# Patient Record
Sex: Female | Born: 1955 | Hispanic: Yes | Smoking: Former smoker
Health system: Southern US, Community
[De-identification: ages and names within clinical notes are randomized; demographics above are authoritative.]

## PROBLEM LIST (undated history)

## (undated) DIAGNOSIS — E079 Disorder of thyroid, unspecified: Secondary | ICD-10-CM

## (undated) DIAGNOSIS — E785 Hyperlipidemia, unspecified: Secondary | ICD-10-CM

## (undated) DIAGNOSIS — I10 Essential (primary) hypertension: Secondary | ICD-10-CM

## (undated) HISTORY — PX: ABDOMINAL HYSTERECTOMY: SHX81

## (undated) HISTORY — PX: TONSILLECTOMY: SUR1361

## (undated) HISTORY — PX: NOSE SURGERY: SHX723

---

## 2006-08-03 ENCOUNTER — Ambulatory Visit: Payer: Self-pay | Admitting: Family Medicine

## 2006-12-22 ENCOUNTER — Ambulatory Visit: Payer: Self-pay | Admitting: Obstetrics and Gynecology

## 2007-01-01 ENCOUNTER — Inpatient Hospital Stay: Payer: Self-pay | Admitting: Obstetrics and Gynecology

## 2007-08-02 ENCOUNTER — Ambulatory Visit: Payer: Self-pay | Admitting: Obstetrics and Gynecology

## 2007-10-19 ENCOUNTER — Ambulatory Visit: Payer: Self-pay | Admitting: Gastroenterology

## 2007-10-23 ENCOUNTER — Ambulatory Visit: Payer: Self-pay | Admitting: Gastroenterology

## 2007-12-03 ENCOUNTER — Ambulatory Visit: Payer: Self-pay | Admitting: Gastroenterology

## 2008-11-06 ENCOUNTER — Ambulatory Visit: Payer: Self-pay | Admitting: Family Medicine

## 2009-01-01 ENCOUNTER — Ambulatory Visit: Payer: Self-pay | Admitting: Otolaryngology

## 2009-11-12 ENCOUNTER — Ambulatory Visit: Payer: Self-pay | Admitting: Family Medicine

## 2010-10-23 DIAGNOSIS — I200 Unstable angina: Secondary | ICD-10-CM

## 2010-10-24 ENCOUNTER — Observation Stay: Payer: Self-pay | Admitting: Internal Medicine

## 2010-10-24 DIAGNOSIS — R079 Chest pain, unspecified: Secondary | ICD-10-CM

## 2010-12-09 ENCOUNTER — Ambulatory Visit: Payer: Self-pay | Admitting: Family Medicine

## 2012-01-06 ENCOUNTER — Ambulatory Visit: Payer: Self-pay | Admitting: Family Medicine

## 2013-01-08 ENCOUNTER — Ambulatory Visit: Payer: Self-pay | Admitting: Family Medicine

## 2013-04-15 ENCOUNTER — Ambulatory Visit: Payer: Self-pay | Admitting: Gastroenterology

## 2013-05-11 ENCOUNTER — Emergency Department: Payer: Self-pay | Admitting: Emergency Medicine

## 2014-08-20 ENCOUNTER — Other Ambulatory Visit: Payer: Self-pay | Admitting: Family Medicine

## 2014-08-20 DIAGNOSIS — Z1231 Encounter for screening mammogram for malignant neoplasm of breast: Secondary | ICD-10-CM

## 2014-09-16 ENCOUNTER — Ambulatory Visit
Admission: RE | Admit: 2014-09-16 | Discharge: 2014-09-16 | Disposition: A | Discharge status: Home / Self Care | Payer: BC Managed Care – PPO | Source: Ambulatory Visit | Attending: Family Medicine | Admitting: Family Medicine

## 2014-09-16 DIAGNOSIS — Z1231 Encounter for screening mammogram for malignant neoplasm of breast: Secondary | ICD-10-CM | POA: Insufficient documentation

## 2015-09-22 ENCOUNTER — Other Ambulatory Visit: Payer: Self-pay | Admitting: Family Medicine

## 2015-09-22 DIAGNOSIS — Z1231 Encounter for screening mammogram for malignant neoplasm of breast: Secondary | ICD-10-CM

## 2017-07-07 ENCOUNTER — Other Ambulatory Visit: Payer: Self-pay | Admitting: Nurse Practitioner

## 2017-07-07 DIAGNOSIS — Z1231 Encounter for screening mammogram for malignant neoplasm of breast: Secondary | ICD-10-CM

## 2018-05-10 ENCOUNTER — Encounter: Payer: Self-pay | Admitting: Emergency Medicine

## 2018-05-10 ENCOUNTER — Other Ambulatory Visit: Payer: Self-pay

## 2018-05-10 ENCOUNTER — Ambulatory Visit
Admission: EM | Admit: 2018-05-10 | Discharge: 2018-05-10 | Disposition: A | Discharge status: Home / Self Care | Payer: BC Managed Care – PPO | Attending: Emergency Medicine | Admitting: Emergency Medicine

## 2018-05-10 DIAGNOSIS — J101 Influenza due to other identified influenza virus with other respiratory manifestations: Secondary | ICD-10-CM | POA: Diagnosis not present

## 2018-05-10 HISTORY — DX: Hyperlipidemia, unspecified: E78.5

## 2018-05-10 HISTORY — DX: Disorder of thyroid, unspecified: E07.9

## 2018-05-10 HISTORY — DX: Essential (primary) hypertension: I10

## 2018-05-10 LAB — RAPID INFLUENZA A&B ANTIGENS
Influenza A (ARMC): POSITIVE — AB
Influenza B (ARMC): NEGATIVE

## 2018-05-10 MED ORDER — BALOXAVIR MARBOXIL(40 MG DOSE) 2 X 20 MG PO TBPK: 2.0000 | ORAL_TABLET | Freq: Once | ORAL | 0 refills | Status: DC

## 2018-05-10 MED ORDER — FLUTICASONE PROPIONATE 50 MCG/ACT NA SUSP: 2.0000 | Freq: Every day | NASAL | 0 refills | Status: DC

## 2018-05-10 MED ORDER — BALOXAVIR MARBOXIL(40 MG DOSE) 2 X 20 MG PO TBPK: 2.0000 | ORAL_TABLET | Freq: Once | ORAL | 0 refills | Status: AC

## 2018-05-10 MED ORDER — HYDROCOD POLST-CPM POLST ER 10-8 MG/5ML PO SUER: 5.0000 mL | Freq: Two times a day (BID) | ORAL | 0 refills | Status: AC | PRN

## 2018-05-10 MED ORDER — FLUTICASONE PROPIONATE 50 MCG/ACT NA SUSP: 2.0000 | Freq: Every day | NASAL | 0 refills | Status: AC

## 2018-05-10 MED ORDER — HYDROCOD POLST-CPM POLST ER 10-8 MG/5ML PO SUER: 5.0000 mL | Freq: Two times a day (BID) | ORAL | 0 refills | Status: DC | PRN

## 2018-05-10 NOTE — Discharge Instructions (Addendum)
Xofluza for the flu.  Tussionex for the cough, Flonase, 1 g of Tylenol combined with 400 mg of ibuprofen together 3 or 4 times a day as needed.  Push Fluids.

## 2018-05-10 NOTE — ED Triage Notes (Addendum)
Patient in today c/o cough and fever (102) x 1 day. Patient also c/o headache and body aches. Patient's last dose of Tylenol was at ~5:30am today. Patient has tried OTC Dayquil/Nyquil.

## 2018-05-10 NOTE — ED Provider Notes (Signed)
HPI  SUBJECTIVE:  Jill Fritz is a 63 y.o. female who presents with the acute onset of body aches, headaches, cough, fevers T-max 102 starting yesterday.  She reports postnasal drip, sore throat, mild photophobia.  She is taking Tylenol, DayQuil, NyQuil.  The Tylenol helps.  Last dose of Tylenol was within 4 to 6 hours of evaluation.  No aggravating factors.  States that she is unable to sleep at night secondary to the cough.  No nasal congestion, rhinorrhea, neck stiffness, wheezing, chest pain, shortness of breath, vomiting, diarrhea, rash.  She did not get a flu shot this year.  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  Past medical history negative for asthma, emphysema, COPD, smoking, diabetes, hypertension.  PMD: [facility].    Past Medical History:  Diagnosis Date  . Hyperlipidemia   . Hypertension   . Thyroid disease     Past Surgical History:  Procedure Laterality Date  . ABDOMINAL HYSTERECTOMY    . NOSE SURGERY     nasal perforation  . TONSILLECTOMY      Family History  Problem Relation Age of Onset  . Cushing syndrome Mother   . Diabetes Mother   . Heart attack Father 38  . Hypertension Father     Social History   Tobacco Use  . Smoking status: Former Smoker    Last attempt to quit: 05/10/1998    Years since quitting: 20.0  . Smokeless tobacco: Never Used  Substance Use Topics  . Alcohol use: Yes    Comment: occassional  . Drug use: Never    No current facility-administered medications for this encounter.   Current Outpatient Medications:  .  atorvastatin (LIPITOR) 10 MG tablet, TAKE 1 TABLET BY MOUTH EVERY DAY, Disp: , Rfl:  .  levothyroxine (SYNTHROID, LEVOTHROID) 50 MCG tablet, Take by mouth., Disp: , Rfl:  .  lisinopril-hydrochlorothiazide (PRINZIDE,ZESTORETIC) 10-12.5 MG tablet, TAKE 1 TABLET BY MOUTH EVERY DAY, Disp: , Rfl:  .  Baloxavir Marboxil,40 MG Dose, (XOFLUZA) 2 x 20 MG TBPK, Take 2 tablets by mouth  once for 1 dose., Disp: 2 each, Rfl: 0 .  chlorpheniramine-HYDROcodone (TUSSIONEX PENNKINETIC ER) 10-8 MG/5ML SUER, Take 5 mLs by mouth every 12 (twelve) hours as needed for cough., Disp: 60 mL, Rfl: 0 .  fluticasone (FLONASE) 50 MCG/ACT nasal spray, Place 2 sprays into both nostrils daily., Disp: 16 g, Rfl: 0  Allergies  Allergen Reactions  . Aspirin Nausea Only     ROS  As noted in HPI.   Physical Exam  BP 117/81 (BP Location: Left Arm)   Pulse 95   Temp 98.2 F (36.8 C) (Oral)   Resp 16   Ht 5' (1.524 m)   Wt 62.1 kg   SpO2 97%   BMI 26.76 kg/m   Constitutional: Well developed, well nourished, no acute distress Eyes: PERRL, EOMI, conjunctiva normal bilaterally HENT: Normocephalic, atraumatic,mucus membranes moist.  Clear mild nasal congestion.  Normal turbinates.  No sinus tenderness.  Normal oropharynx. tonsils surgically absent.  Positive cobblestoning.  No postnasal drip. Neck: No cervical lymphadenopathy, meningismus. Respiratory: Clear to auscultation bilaterally, no rales, no wheezing, no rhonchi Cardiovascular: Normal rate and rhythm, no murmurs, no gallops, no rubs GI: Soft, nondistended, normal bowel sounds, mild diffuse tenderness patient thinks that this is from coughing, no rebound, no guarding Back: no CVAT skin: No rash, skin intact Musculoskeletal: No edema, no tenderness, no deformities Neurologic: Alert & oriented x 3, CN  II-XII grossly intact, no motor deficits, sensation grossly intact Psychiatric: Speech and behavior appropriate   ED Course   Medications - No data to display  Orders Placed This Encounter  Procedures  . Rapid Influenza A&B Antigens ([facility] only)    Standing Status:   Standing    Number of Occurrences:   1  . Droplet precaution    Standing Status:   Standing    Number of Occurrences:   1   Results for orders placed or performed during the hospital encounter of 05/10/18 (from the past 24 hour(s))  Rapid Influenza A&B Antigens  ([facility] only)     Status: Abnormal   Collection Time: 05/10/18  9:17 AM  Result Value Ref Range   Influenza A ([facility]) POSITIVE (A) NEGATIVE   Influenza B ([facility]) NEGATIVE NEGATIVE   No results found.  ED Clinical Impression  Influenza A   ED Assessment/Plan   [place] Narcotic database reviewed for this patient, and feel that the risk/benefit ratio today is favorable for proceeding with a prescription for controlled substance.  No opiate prescriptions in 2 years.  Flu a positive.  Sending home with Lasandra Beech per patient request.  Giving her a list of pharmacies that carry it. Also Tussionex for the cough, Flonase, Tylenol/ibuprofen together 3 or 4 times a day as needed.  Push Fluids.  Printed copies of prescriptions so that she can take them anywhere.  Work note for Friday and Monday as she is a Runner, broadcasting/film/video.  Discussed labs,  MDM, treatment plan, and plan for follow-up with patient Discussed sn/sx that should prompt return to the ED. patient agrees with plan.   Meds ordered this encounter  Medications  . DISCONTD: chlorpheniramine-HYDROcodone (TUSSIONEX PENNKINETIC ER) 10-8 MG/5ML SUER    Sig: Take 5 mLs by mouth every 12 (twelve) hours as needed for cough.    Dispense:  60 mL    Refill:  0  . DISCONTD: fluticasone (FLONASE) 50 MCG/ACT nasal spray    Sig: Place 2 sprays into both nostrils daily.    Dispense:  16 g    Refill:  0  . DISCONTD: Baloxavir Marboxil,40 MG Dose, (XOFLUZA) 2 x 20 MG TBPK    Sig: Take 2 tablets by mouth once for 1 dose.    Dispense:  2 each    Refill:  0  . Baloxavir Marboxil,40 MG Dose, (XOFLUZA) 2 x 20 MG TBPK    Sig: Take 2 tablets by mouth once for 1 dose.    Dispense:  2 each    Refill:  0  . chlorpheniramine-HYDROcodone (TUSSIONEX PENNKINETIC ER) 10-8 MG/5ML SUER    Sig: Take 5 mLs by mouth every 12 (twelve) hours as needed for cough.    Dispense:  60 mL    Refill:  0  . fluticasone (FLONASE) 50 MCG/ACT nasal spray    Sig: Place 2 sprays into both nostrils  daily.    Dispense:  16 g    Refill:  0    *This clinic note was created using Scientist, clinical (histocompatibility and immunogenetics). Therefore, there may be occasional mistakes despite careful proofreading.  ?    Domenick Gong, MD 05/11/18 [phone]

## 2018-09-12 ENCOUNTER — Other Ambulatory Visit: Payer: Self-pay | Admitting: Nurse Practitioner

## 2018-09-12 DIAGNOSIS — Z1231 Encounter for screening mammogram for malignant neoplasm of breast: Secondary | ICD-10-CM

## 2018-10-02 ENCOUNTER — Other Ambulatory Visit: Payer: Self-pay

## 2018-10-02 ENCOUNTER — Ambulatory Visit
Admission: RE | Admit: 2018-10-02 | Discharge: 2018-10-02 | Disposition: A | Discharge status: Home / Self Care | Payer: BC Managed Care – PPO | Source: Ambulatory Visit | Attending: Nurse Practitioner | Admitting: Nurse Practitioner

## 2018-10-02 ENCOUNTER — Encounter (INDEPENDENT_AMBULATORY_CARE_PROVIDER_SITE_OTHER): Payer: Self-pay

## 2018-10-02 DIAGNOSIS — Z1231 Encounter for screening mammogram for malignant neoplasm of breast: Secondary | ICD-10-CM | POA: Diagnosis present

## 2018-12-05 ENCOUNTER — Other Ambulatory Visit: Payer: Self-pay

## 2018-12-05 DIAGNOSIS — Z20822 Contact with and (suspected) exposure to covid-19: Secondary | ICD-10-CM

## 2018-12-06 LAB — NOVEL CORONAVIRUS, NAA: SARS-CoV-2, NAA: NOT DETECTED

## 2018-12-06 LAB — SPECIMEN STATUS REPORT

## 2019-09-24 ENCOUNTER — Other Ambulatory Visit: Payer: Self-pay | Admitting: Nurse Practitioner

## 2019-09-24 DIAGNOSIS — Z1231 Encounter for screening mammogram for malignant neoplasm of breast: Secondary | ICD-10-CM

## 2019-10-03 ENCOUNTER — Emergency Department
Admission: EM | Admit: 2019-10-03 | Discharge: 2019-10-03 | Disposition: A | Discharge status: Home / Self Care | Payer: BC Managed Care – PPO | Attending: Student in an Organized Health Care Education/Training Program | Admitting: Student in an Organized Health Care Education/Training Program

## 2019-10-03 ENCOUNTER — Emergency Department: Payer: BC Managed Care – PPO

## 2019-10-03 ENCOUNTER — Other Ambulatory Visit: Payer: Self-pay

## 2019-10-03 ENCOUNTER — Encounter: Payer: Self-pay | Admitting: Emergency Medicine

## 2019-10-03 DIAGNOSIS — Z87891 Personal history of nicotine dependence: Secondary | ICD-10-CM | POA: Diagnosis not present

## 2019-10-03 DIAGNOSIS — I10 Essential (primary) hypertension: Secondary | ICD-10-CM | POA: Diagnosis not present

## 2019-10-03 DIAGNOSIS — R42 Dizziness and giddiness: Secondary | ICD-10-CM | POA: Diagnosis present

## 2019-10-03 LAB — BASIC METABOLIC PANEL
Anion gap: 13 (ref 5–15)
BUN: 16 mg/dL (ref 8–23)
CO2: 23 mmol/L (ref 22–32)
Calcium: 9.7 mg/dL (ref 8.9–10.3)
Chloride: 103 mmol/L (ref 98–111)
Creatinine, Ser: 0.72 mg/dL (ref 0.44–1.00)
GFR calc Af Amer: >60 mL/min (ref >60)
GFR calc non Af Amer: >60 mL/min (ref >60)
Glucose, Bld: 105 mg/dL — ABNORMAL HIGH (ref 70–99)
Potassium: 3.9 mmol/L (ref 3.5–5.1)
Sodium: 139 mmol/L (ref 135–145)

## 2019-10-03 LAB — CBC
HCT: 41.8 % (ref 36.0–46.0)
Hemoglobin: 14.6 g/dL (ref 12.0–15.0)
MCH: 33.3 pg (ref 26.0–34.0)
MCHC: 34.9 g/dL (ref 30.0–36.0)
MCV: 95.2 fL (ref 80.0–100.0)
Platelets: 256 10*3/uL (ref 150–400)
RBC: 4.39 MIL/uL (ref 3.87–5.11)
RDW: 12.6 % (ref 11.5–15.5)
WBC: 7 10*3/uL (ref 4.0–10.5)
nRBC: 0 % (ref 0.0–0.2)

## 2019-10-03 LAB — GLUCOSE, CAPILLARY: Glucose-Capillary: 89 mg/dL (ref 70–99)

## 2019-10-03 LAB — TROPONIN I (HIGH SENSITIVITY): Troponin I (High Sensitivity): 3 ng/L (ref <18)

## 2019-10-03 MED ORDER — AMLODIPINE BESYLATE 5 MG PO TABS: 5.0000 mg | ORAL_TABLET | Freq: Every day | ORAL | 0 refills | Status: AC

## 2019-10-03 NOTE — ED Provider Notes (Signed)
[facility] Emergency Department Provider Note    First MD Initiated Contact with Patient 10/03/19 1834     (approximate)  I have reviewed the triage vital signs and the nursing notes.   HISTORY  Chief Complaint Dizziness    HPI Jill Fritz is a 64 y.o. female below listed past medical history presents to the ER for evaluation of high blood pressure as well as some blurry vision.  Feels like that has subsided.  States her blood pressure were in the 180s and 190s when checking at home.  Was feeling very stressed at that time.  Felt like her legs were becoming weak bilaterally.  Denies any weakness on one side of her body.  Denies any headache.  Did have some brief left shoulder pain.  No neck pain.  Denies any pain or shortness of breath at this time.  States has been a very stressful year.  She did take her blood pressure medication this morning and then when noting her blood pressure was high again she took another.    Past Medical History:  Diagnosis Date  . Hyperlipidemia   . Hypertension   . Thyroid disease    Family History  Problem Relation Age of Onset  . Cushing syndrome Mother   . Diabetes Mother   . Heart attack Father 77  . Hypertension Father   . Breast cancer Sister    Past Surgical History:  Procedure Laterality Date  . ABDOMINAL HYSTERECTOMY    . NOSE SURGERY     nasal perforation  . TONSILLECTOMY     There are no problems to display for this patient.     Prior to Admission medications   Medication Sig Start Date End Date Taking? Authorizing Provider  amLODipine (NORVASC) 5 MG tablet Take 1 tablet (5 mg total) by mouth daily. 10/03/19 10/02/20  Merlyn Lot, MD  atorvastatin (LIPITOR) 10 MG tablet TAKE 1 TABLET BY MOUTH EVERY DAY 01/02/17   [provider]  chlorpheniramine-HYDROcodone (TUSSIONEX PENNKINETIC ER) 10-8 MG/5ML SUER Take 5 mLs by mouth every 12 (twelve) hours as needed for cough. 05/10/18    Melynda Ripple, MD  fluticasone (FLONASE) 50 MCG/ACT nasal spray Place 2 sprays into both nostrils daily. 05/10/18   Melynda Ripple, MD  levothyroxine (SYNTHROID, LEVOTHROID) 50 MCG tablet Take by mouth. 02/13/18   [provider]  lisinopril-hydrochlorothiazide (PRINZIDE,ZESTORETIC) 10-12.5 MG tablet TAKE 1 TABLET BY MOUTH EVERY DAY 04/18/18   [provider]    Allergies Aspirin    Social History Social History   Tobacco Use  . Smoking status: Former Smoker    Quit date: 05/10/1998    Years since quitting: 21.4  . Smokeless tobacco: Never Used  Vaping Use  . Vaping Use: Never used  Substance Use Topics  . Alcohol use: Yes    Comment: occassional  . Drug use: Never    Review of Systems Patient denies headaches, rhinorrhea, blurry vision, numbness, shortness of breath, chest pain, edema, cough, abdominal pain, nausea, vomiting, diarrhea, dysuria, fevers, rashes or hallucinations unless otherwise stated above in HPI. ____________________________________________   PHYSICAL EXAM:  VITAL SIGNS: Vitals:   10/03/19 2010 10/03/19 2011  BP:  (!) 150/89  Pulse: 77 76  Resp: 17 19  Temp:    SpO2: 96% 97%    Constitutional: Alert and oriented.  Eyes: Conjunctivae are normal.  Head: Atraumatic. Nose: No congestion/rhinnorhea. Mouth/Throat: Mucous membranes are moist.   Neck: No stridor. Painless ROM.  Cardiovascular: Normal rate,  regular rhythm. Grossly normal heart sounds.  Good peripheral circulation. Respiratory: Normal respiratory effort.  No retractions. Lungs CTAB. Gastrointestinal: Soft and nontender. No distention. No abdominal bruits. No CVA tenderness. Genitourinary:  Musculoskeletal: No lower extremity tenderness nor edema.  No joint effusions. Neurologic:  Normal speech and language. No gross focal neurologic deficits are appreciated. No facial droop Skin:  Skin is warm, dry and intact. No rash noted. Psychiatric: Mood and affect are normal.  Speech and behavior are normal.  ____________________________________________   LABS (all labs ordered are listed, but only abnormal results are displayed)  Results for orders placed or performed during the hospital encounter of 10/03/19 (from the past 24 hour(s))  Basic metabolic panel     Status: Abnormal   Collection Time: 10/03/19  2:27 PM  Result Value Ref Range   Sodium 139 135 - 145 mmol/L   Potassium 3.9 3.5 - 5.1 mmol/L   Chloride 103 98 - 111 mmol/L   CO2 23 22 - 32 mmol/L   Glucose, Bld 105 (H) 70 - 99 mg/dL   BUN 16 8 - 23 mg/dL   Creatinine, Ser 5.78 0.44 - 1.00 mg/dL   Calcium 9.7 8.9 - 46.9 mg/dL   GFR calc non Af Amer >60 >60 mL/min   GFR calc Af Amer >60 >60 mL/min   Anion gap 13 5 - 15  CBC     Status: None   Collection Time: 10/03/19  2:27 PM  Result Value Ref Range   WBC 7.0 4.0 - 10.5 K/uL   RBC 4.39 3.87 - 5.11 MIL/uL   Hemoglobin 14.6 12.0 - 15.0 g/dL   HCT 62.9 36 - 46 %   MCV 95.2 80.0 - 100.0 fL   MCH 33.3 26.0 - 34.0 pg   MCHC 34.9 30.0 - 36.0 g/dL   RDW 52.8 41.3 - 24.4 %   Platelets 256 150 - 400 K/uL   nRBC 0.0 0.0 - 0.2 %  Troponin I (High Sensitivity)     Status: None   Collection Time: 10/03/19  2:27 PM  Result Value Ref Range   Troponin I (High Sensitivity) 3 <18 ng/L  Glucose, capillary     Status: None   Collection Time: 10/03/19  2:31 PM  Result Value Ref Range   Glucose-Capillary 89 70 - 99 mg/dL   ____________________________________________  EKG My review and personal interpretation at Time: 14:14   Indication: htn  Rate: 80  Rhythm: sinus Axis: normal Other: noral intervals, nonspecific t wave abn, no stemi or depression ____________________________________________  RADIOLOGY I personally reviewed all radiographic images ordered to evaluate for the above acute complaints and reviewed radiology reports and findings.  These findings were personally discussed with the patient.  Please see medical record for radiology  report.  ____________________________________________   PROCEDURES  Procedure(s) performed:  Procedures    Critical Care performed: no ____________________________________________   INITIAL IMPRESSION / ASSESSMENT AND PLAN / ED COURSE  Pertinent labs & imaging results that were available during my care of the patient were reviewed by me and considered in my medical decision making (see chart for details).   DDX: htn, acs, cva, med noncomlpiance, aki, stress  Jill Fritz is a 64 y.o. who presents to the ED with Non-distressed patient presenting with concern for elevated BP and blurry visiton. Patient is AF,VSS with HTN in ED. Exam as above. Given current presentation have considered the above differential. No report of missed antihypertensive doses or medical non-compliance. Extensive evaluation of possible end  organ damage pursued in ED. No evidence of acute renal dysfunction. Neuro exam without focal deficits. EKG without evidence of ischemia. Trop negative. Renal function nml. Not consistent with CHF, malignant htn, adrenergic crisis or hypertensive emergency. CT head orderd out of triage reassuring.  Will have patient add norvasc to current regimen given Bps and follow up with PCP for BP recheck. Discussed strict return parameters.      The patient was evaluated in Emergency Department today for the symptoms described in the history of present illness. He/she was evaluated in the context of the global COVID-19 pandemic, which necessitated consideration that the patient might be at risk for infection with the SARS-CoV-2 virus that causes COVID-19. Institutional protocols and algorithms that pertain to the evaluation of patients at risk for COVID-19 are in a state of rapid change based on information released by regulatory bodies including the CDC and federal and state organizations. These policies and algorithms were followed during the patient's care in the ED.  As part of  my medical decision making, I reviewed the following data within the electronic MEDICAL RECORD NUMBER Nursing notes reviewed and incorporated, Labs reviewed, notes from prior ED visits and [place] Controlled Substance Database   ____________________________________________   FINAL CLINICAL IMPRESSION(S) / ED DIAGNOSES  Final diagnoses:  Hypertension, unspecified type      NEW MEDICATIONS STARTED DURING THIS VISIT:  New Prescriptions   AMLODIPINE (NORVASC) 5 MG TABLET    Take 1 tablet (5 mg total) by mouth daily.     Note:  This document was prepared using Dragon voice recognition software and may include unintentional dictation errors.    Willy Eddy, MD 10/03/19 2100

## 2019-10-03 NOTE — ED Triage Notes (Signed)
Patient presents to the ED with hypertension, dizziness and blurry vision.  Patient states she noticed her blood pressure has been slightly elevated over the past 2 days.  Patient reports feeling dizzy approx. 1 hour prior to arrival and began to have blurry vision on the way to the hospital.  Patient's skin is slightly clammy and patient reports feeling anxious.  Patient denies chest pain.

## 2019-10-08 ENCOUNTER — Ambulatory Visit
Admission: RE | Admit: 2019-10-08 | Discharge: 2019-10-08 | Disposition: A | Discharge status: Home / Self Care | Payer: BC Managed Care – PPO | Source: Ambulatory Visit | Attending: Nurse Practitioner | Admitting: Nurse Practitioner

## 2019-10-08 ENCOUNTER — Other Ambulatory Visit: Payer: Self-pay

## 2019-10-08 DIAGNOSIS — Z1231 Encounter for screening mammogram for malignant neoplasm of breast: Secondary | ICD-10-CM | POA: Insufficient documentation

## 2020-02-10 ENCOUNTER — Other Ambulatory Visit: Payer: Self-pay | Admitting: Nurse Practitioner

## 2020-02-10 DIAGNOSIS — R10826 Epigastric rebound abdominal tenderness: Secondary | ICD-10-CM

## 2020-02-10 DIAGNOSIS — R1011 Right upper quadrant pain: Secondary | ICD-10-CM

## 2020-02-14 ENCOUNTER — Other Ambulatory Visit: Payer: Self-pay

## 2020-02-14 ENCOUNTER — Ambulatory Visit
Admission: RE | Admit: 2020-02-14 | Discharge: 2020-02-14 | Disposition: A | Discharge status: Home / Self Care | Payer: BC Managed Care – PPO | Source: Ambulatory Visit | Attending: Nurse Practitioner | Admitting: Nurse Practitioner

## 2020-02-14 DIAGNOSIS — R10826 Epigastric rebound abdominal tenderness: Secondary | ICD-10-CM | POA: Diagnosis not present

## 2020-02-14 DIAGNOSIS — R1011 Right upper quadrant pain: Secondary | ICD-10-CM | POA: Diagnosis present

## 2020-02-20 ENCOUNTER — Other Ambulatory Visit: Payer: Self-pay | Admitting: Nurse Practitioner

## 2020-02-20 DIAGNOSIS — K838 Other specified diseases of biliary tract: Secondary | ICD-10-CM

## 2020-02-28 ENCOUNTER — Other Ambulatory Visit: Payer: Self-pay | Admitting: Nurse Practitioner

## 2020-02-28 DIAGNOSIS — K838 Other specified diseases of biliary tract: Secondary | ICD-10-CM

## 2020-03-03 ENCOUNTER — Ambulatory Visit
Admission: RE | Admit: 2020-03-03 | Discharge: 2020-03-03 | Disposition: A | Discharge status: Home / Self Care | Payer: BC Managed Care – PPO | Source: Ambulatory Visit | Attending: Nurse Practitioner | Admitting: Nurse Practitioner

## 2020-03-03 ENCOUNTER — Other Ambulatory Visit: Payer: Self-pay

## 2020-03-03 DIAGNOSIS — K838 Other specified diseases of biliary tract: Secondary | ICD-10-CM | POA: Insufficient documentation

## 2020-03-03 MED ORDER — GADOBUTROL 1 MMOL/ML IV SOLN
6.0000 mL | Freq: Once | INTRAVENOUS | Status: AC | PRN
  Administered 2020-03-03: 6 mL via INTRAVENOUS

## 2020-10-06 ENCOUNTER — Other Ambulatory Visit: Payer: Self-pay | Admitting: Nurse Practitioner

## 2020-10-06 DIAGNOSIS — Z1231 Encounter for screening mammogram for malignant neoplasm of breast: Secondary | ICD-10-CM

## 2020-11-23 ENCOUNTER — Ambulatory Visit: Payer: BC Managed Care – PPO

## 2020-12-08 ENCOUNTER — Ambulatory Visit
Admission: RE | Admit: 2020-12-08 | Discharge: 2020-12-08 | Disposition: A | Discharge status: Home / Self Care | Payer: BC Managed Care – PPO | Source: Ambulatory Visit | Attending: Nurse Practitioner | Admitting: Nurse Practitioner

## 2020-12-08 ENCOUNTER — Other Ambulatory Visit: Payer: Self-pay

## 2020-12-08 DIAGNOSIS — Z1231 Encounter for screening mammogram for malignant neoplasm of breast: Secondary | ICD-10-CM | POA: Diagnosis present

## 2021-02-15 ENCOUNTER — Other Ambulatory Visit: Payer: Self-pay | Admitting: Nurse Practitioner

## 2021-02-15 DIAGNOSIS — R112 Nausea with vomiting, unspecified: Secondary | ICD-10-CM

## 2021-03-11 ENCOUNTER — Ambulatory Visit: Admission: RE | Admit: 2021-03-11 | Payer: BC Managed Care – PPO | Source: Ambulatory Visit

## 2021-10-26 ENCOUNTER — Other Ambulatory Visit: Payer: Self-pay | Admitting: Nurse Practitioner

## 2021-10-26 DIAGNOSIS — Z1231 Encounter for screening mammogram for malignant neoplasm of breast: Secondary | ICD-10-CM

## 2021-11-25 IMAGING — MG DIGITAL SCREENING BILAT W/ TOMO W/ CAD
8 series · 8 of 24 positions shown · non-contrast
Comparison: Previous exam(s).

CLINICAL DATA: Screening.

EXAM:
DIGITAL SCREENING BILATERAL MAMMOGRAM WITH TOMO AND CAD

[L CC synth-2D]
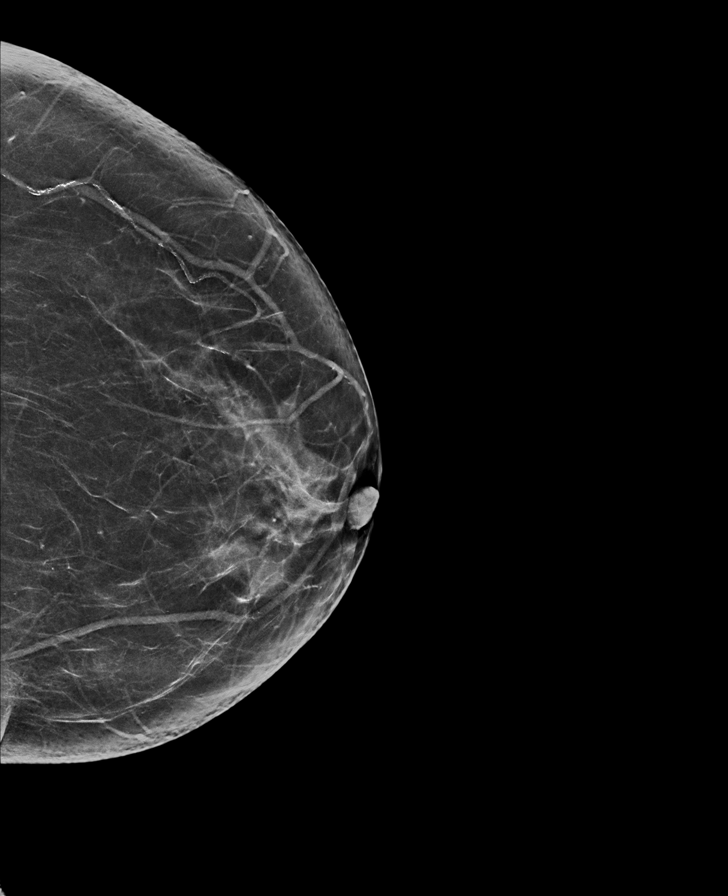

[R CC synth-2D]
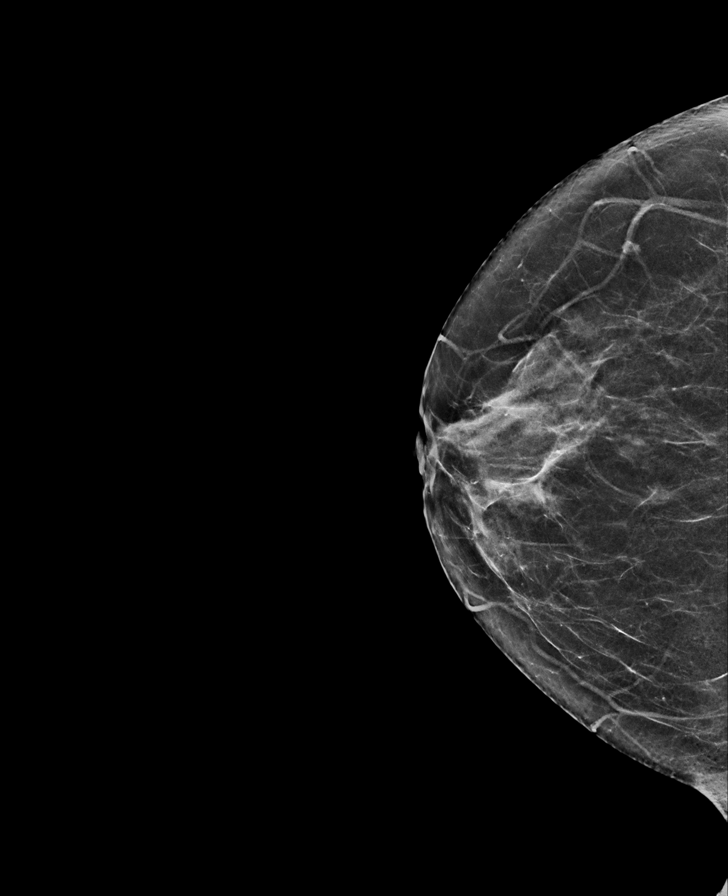

[R MLO synth-2D]
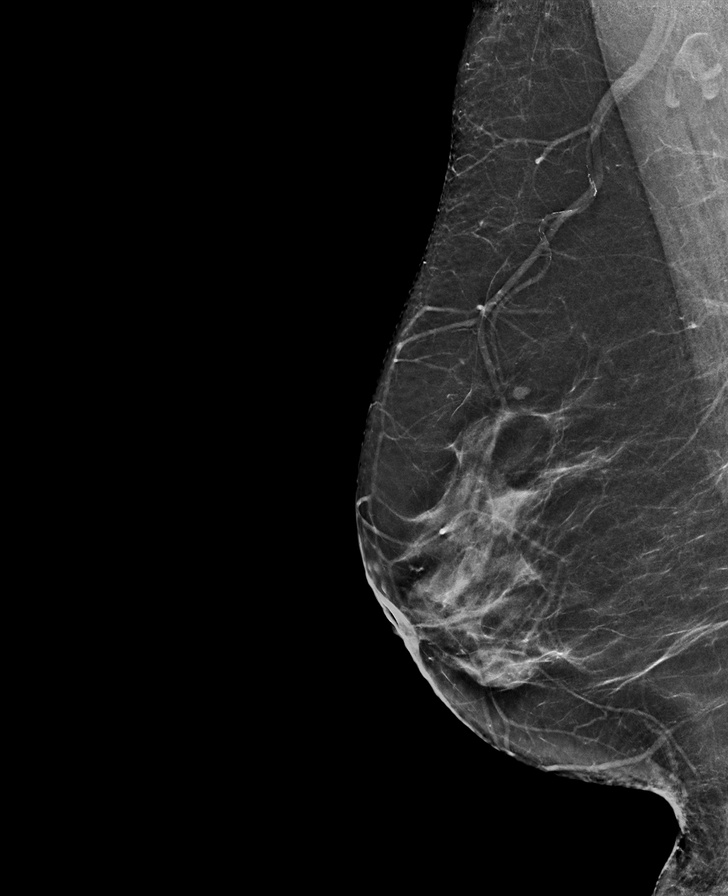

[L MLO synth-2D]
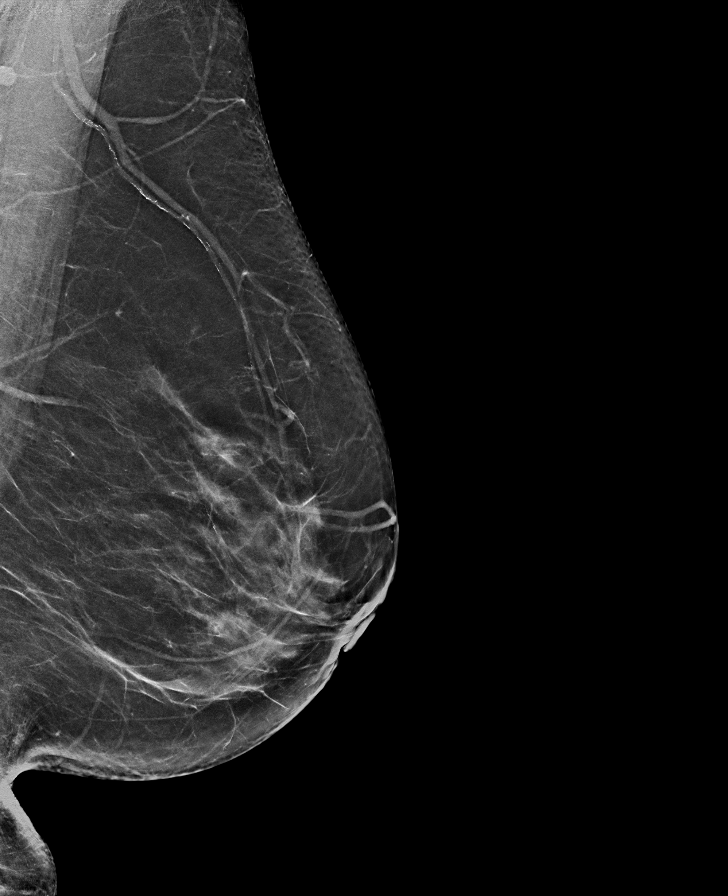

[L CC tomo · tomo slice 35/68.0]
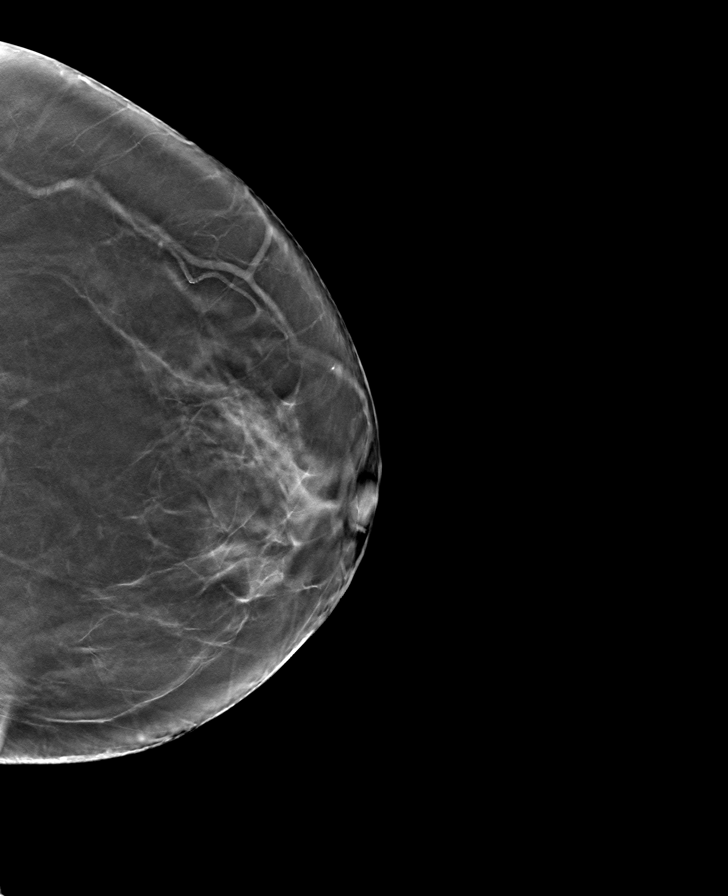

[L MLO tomo · tomo slice 34/67.0]
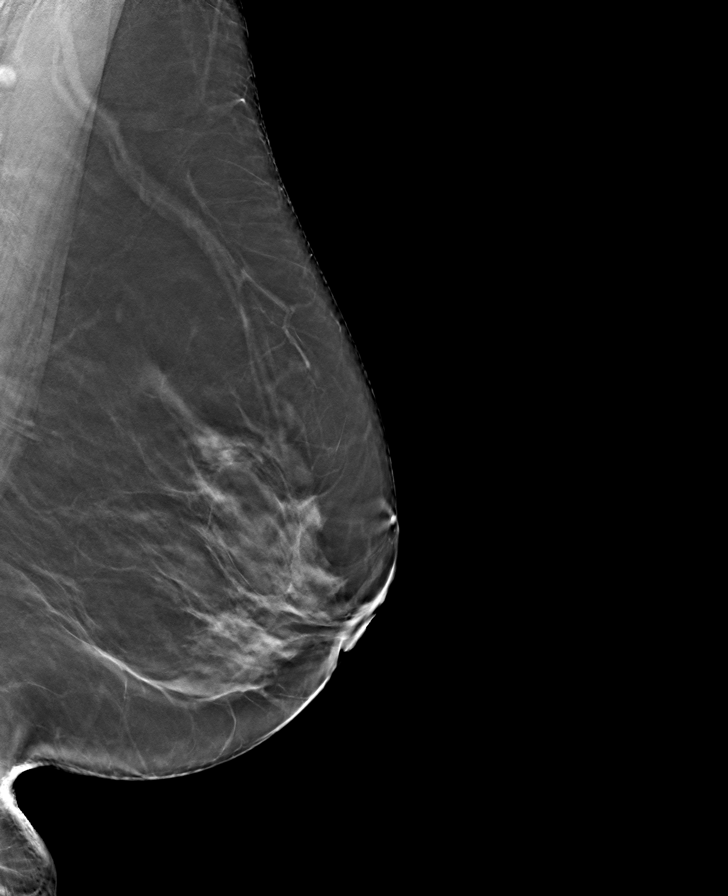

[R CC tomo · tomo slice 31/60.0]
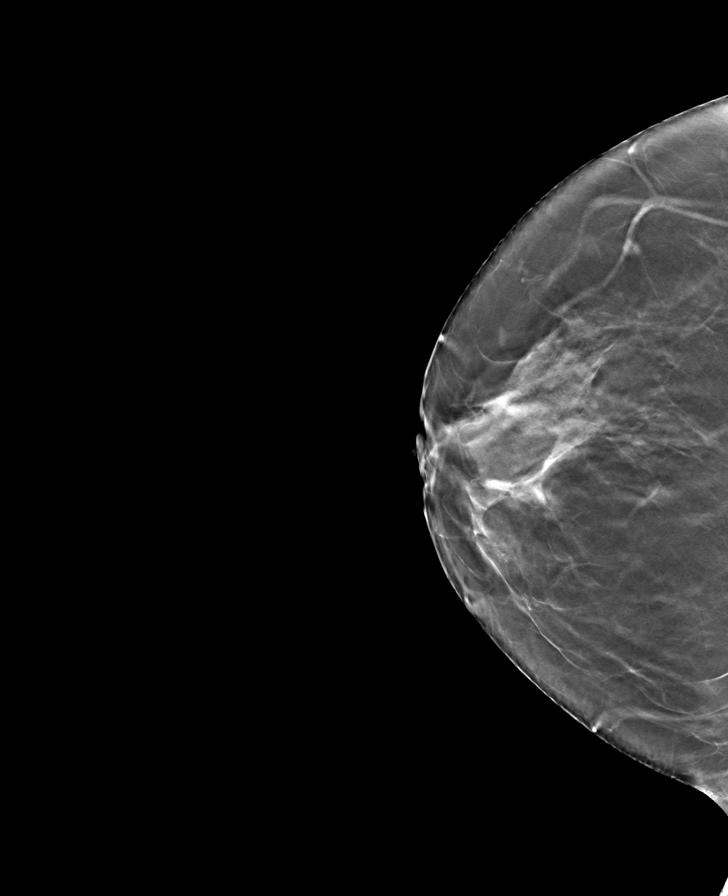

[R MLO tomo · tomo slice 33/66.0]
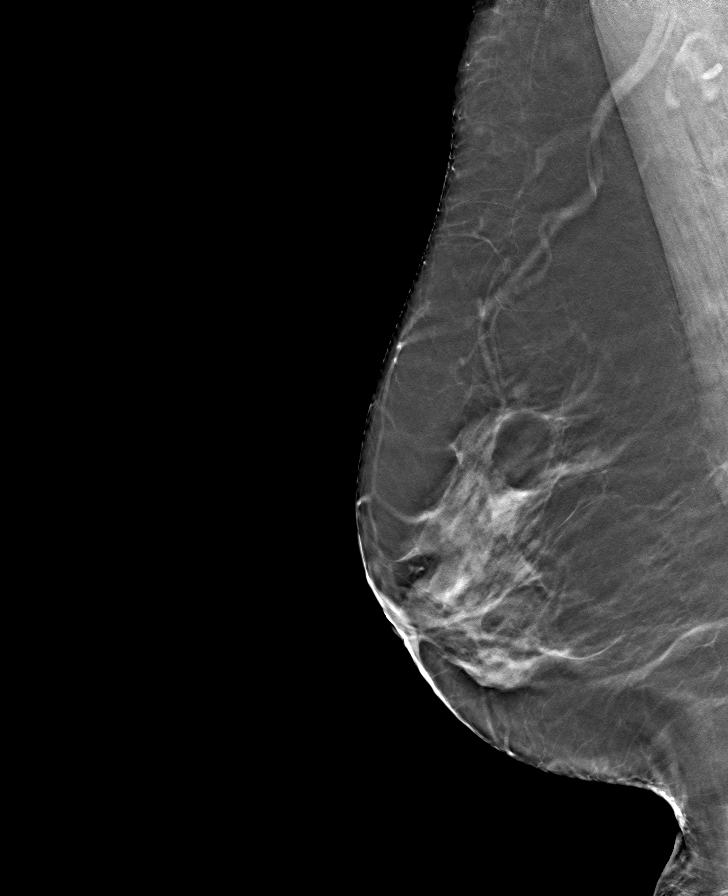

[8 of 24 positions shown; findings below may reference images not displayed]

ACR Breast Density Category b: There are scattered areas of
fibroglandular density.
FINDINGS: There are no findings suspicious for malignancy. Images were
processed with CAD.
IMPRESSION: No mammographic evidence of malignancy. A result letter of this
screening mammogram will be mailed directly to the patient.

RECOMMENDATION:
Screening mammogram in one year. (Code:[id number])

BI-RADS CATEGORY  1: Negative.

## 2021-12-09 ENCOUNTER — Ambulatory Visit
Admission: RE | Admit: 2021-12-09 | Discharge: 2021-12-09 | Disposition: A | Discharge status: Home / Self Care | Payer: BC Managed Care – PPO | Source: Ambulatory Visit | Attending: Nurse Practitioner | Admitting: Nurse Practitioner

## 2021-12-09 DIAGNOSIS — Z1231 Encounter for screening mammogram for malignant neoplasm of breast: Secondary | ICD-10-CM | POA: Diagnosis present

## 2022-08-29 DIAGNOSIS — H40011 Open angle with borderline findings, low risk, right eye: Secondary | ICD-10-CM | POA: Diagnosis not present

## 2022-08-29 DIAGNOSIS — H5203 Hypermetropia, bilateral: Secondary | ICD-10-CM | POA: Diagnosis not present

## 2022-08-29 DIAGNOSIS — H2513 Age-related nuclear cataract, bilateral: Secondary | ICD-10-CM | POA: Diagnosis not present

## 2022-08-29 DIAGNOSIS — H04123 Dry eye syndrome of bilateral lacrimal glands: Secondary | ICD-10-CM | POA: Diagnosis not present

## 2022-10-28 DIAGNOSIS — I10 Essential (primary) hypertension: Secondary | ICD-10-CM | POA: Diagnosis not present

## 2022-10-28 DIAGNOSIS — Z0000 Encounter for general adult medical examination without abnormal findings: Secondary | ICD-10-CM | POA: Diagnosis not present

## 2022-10-28 DIAGNOSIS — E039 Hypothyroidism, unspecified: Secondary | ICD-10-CM | POA: Diagnosis not present

## 2022-10-28 DIAGNOSIS — Z79899 Other long term (current) drug therapy: Secondary | ICD-10-CM | POA: Diagnosis not present

## 2022-10-28 DIAGNOSIS — Z13820 Encounter for screening for osteoporosis: Secondary | ICD-10-CM | POA: Diagnosis not present

## 2022-10-28 DIAGNOSIS — E7800 Pure hypercholesterolemia, unspecified: Secondary | ICD-10-CM | POA: Diagnosis not present

## 2022-10-28 DIAGNOSIS — Z1231 Encounter for screening mammogram for malignant neoplasm of breast: Secondary | ICD-10-CM | POA: Diagnosis not present

## 2022-10-28 DIAGNOSIS — Z780 Asymptomatic menopausal state: Secondary | ICD-10-CM | POA: Diagnosis not present

## 2022-11-01 ENCOUNTER — Other Ambulatory Visit: Payer: Self-pay | Admitting: Nurse Practitioner

## 2022-11-01 DIAGNOSIS — Z1231 Encounter for screening mammogram for malignant neoplasm of breast: Secondary | ICD-10-CM

## 2022-11-14 DIAGNOSIS — M810 Age-related osteoporosis without current pathological fracture: Secondary | ICD-10-CM | POA: Diagnosis not present

## 2022-12-07 DIAGNOSIS — M810 Age-related osteoporosis without current pathological fracture: Secondary | ICD-10-CM | POA: Diagnosis not present

## 2022-12-13 ENCOUNTER — Ambulatory Visit
Admission: RE | Admit: 2022-12-13 | Discharge: 2022-12-13 | Disposition: A | Discharge status: Home / Self Care | Payer: Medicare PPO | Source: Ambulatory Visit | Attending: Nurse Practitioner | Admitting: Nurse Practitioner

## 2022-12-13 DIAGNOSIS — Z1231 Encounter for screening mammogram for malignant neoplasm of breast: Secondary | ICD-10-CM | POA: Insufficient documentation

## 2023-01-25 DIAGNOSIS — M810 Age-related osteoporosis without current pathological fracture: Secondary | ICD-10-CM | POA: Diagnosis not present

## 2023-01-30 DIAGNOSIS — H04123 Dry eye syndrome of bilateral lacrimal glands: Secondary | ICD-10-CM | POA: Diagnosis not present

## 2023-01-30 DIAGNOSIS — H5203 Hypermetropia, bilateral: Secondary | ICD-10-CM | POA: Diagnosis not present

## 2023-01-30 DIAGNOSIS — H40011 Open angle with borderline findings, low risk, right eye: Secondary | ICD-10-CM | POA: Diagnosis not present

## 2023-01-30 DIAGNOSIS — H2513 Age-related nuclear cataract, bilateral: Secondary | ICD-10-CM | POA: Diagnosis not present

## 2023-05-01 DIAGNOSIS — Z6830 Body mass index (BMI) 30.0-30.9, adult: Secondary | ICD-10-CM | POA: Diagnosis not present

## 2023-05-01 DIAGNOSIS — M810 Age-related osteoporosis without current pathological fracture: Secondary | ICD-10-CM | POA: Diagnosis not present

## 2023-05-01 DIAGNOSIS — I10 Essential (primary) hypertension: Secondary | ICD-10-CM | POA: Diagnosis not present

## 2023-05-01 DIAGNOSIS — E669 Obesity, unspecified: Secondary | ICD-10-CM | POA: Diagnosis not present

## 2023-05-01 DIAGNOSIS — Z23 Encounter for immunization: Secondary | ICD-10-CM | POA: Diagnosis not present

## 2023-05-01 DIAGNOSIS — Z79899 Other long term (current) drug therapy: Secondary | ICD-10-CM | POA: Diagnosis not present

## 2023-05-01 DIAGNOSIS — E559 Vitamin D deficiency, unspecified: Secondary | ICD-10-CM | POA: Diagnosis not present

## 2023-05-01 DIAGNOSIS — E7800 Pure hypercholesterolemia, unspecified: Secondary | ICD-10-CM | POA: Diagnosis not present

## 2023-05-01 DIAGNOSIS — E039 Hypothyroidism, unspecified: Secondary | ICD-10-CM | POA: Diagnosis not present

## 2023-07-04 DIAGNOSIS — H04123 Dry eye syndrome of bilateral lacrimal glands: Secondary | ICD-10-CM | POA: Diagnosis not present

## 2023-07-04 DIAGNOSIS — H40011 Open angle with borderline findings, low risk, right eye: Secondary | ICD-10-CM | POA: Diagnosis not present

## 2023-07-04 DIAGNOSIS — H2513 Age-related nuclear cataract, bilateral: Secondary | ICD-10-CM | POA: Diagnosis not present

## 2023-10-31 ENCOUNTER — Other Ambulatory Visit: Payer: Self-pay | Admitting: Nurse Practitioner

## 2023-10-31 DIAGNOSIS — E039 Hypothyroidism, unspecified: Secondary | ICD-10-CM | POA: Diagnosis not present

## 2023-10-31 DIAGNOSIS — M810 Age-related osteoporosis without current pathological fracture: Secondary | ICD-10-CM | POA: Diagnosis not present

## 2023-10-31 DIAGNOSIS — R300 Dysuria: Secondary | ICD-10-CM | POA: Diagnosis not present

## 2023-10-31 DIAGNOSIS — Z1331 Encounter for screening for depression: Secondary | ICD-10-CM | POA: Diagnosis not present

## 2023-10-31 DIAGNOSIS — Z1231 Encounter for screening mammogram for malignant neoplasm of breast: Secondary | ICD-10-CM

## 2023-10-31 DIAGNOSIS — I10 Essential (primary) hypertension: Secondary | ICD-10-CM | POA: Diagnosis not present

## 2023-10-31 DIAGNOSIS — Z0000 Encounter for general adult medical examination without abnormal findings: Secondary | ICD-10-CM | POA: Diagnosis not present

## 2023-10-31 DIAGNOSIS — E7800 Pure hypercholesterolemia, unspecified: Secondary | ICD-10-CM | POA: Diagnosis not present

## 2023-10-31 DIAGNOSIS — Z79899 Other long term (current) drug therapy: Secondary | ICD-10-CM | POA: Diagnosis not present

## 2023-10-31 DIAGNOSIS — Z131 Encounter for screening for diabetes mellitus: Secondary | ICD-10-CM | POA: Diagnosis not present

## 2023-10-31 DIAGNOSIS — E785 Hyperlipidemia, unspecified: Secondary | ICD-10-CM | POA: Diagnosis not present

## 2023-10-31 DIAGNOSIS — E669 Obesity, unspecified: Secondary | ICD-10-CM | POA: Diagnosis not present

## 2023-10-31 DIAGNOSIS — E559 Vitamin D deficiency, unspecified: Secondary | ICD-10-CM | POA: Diagnosis not present

## 2023-11-30 DIAGNOSIS — M810 Age-related osteoporosis without current pathological fracture: Secondary | ICD-10-CM | POA: Diagnosis not present

## 2023-12-07 DIAGNOSIS — E559 Vitamin D deficiency, unspecified: Secondary | ICD-10-CM | POA: Diagnosis not present

## 2023-12-07 DIAGNOSIS — M810 Age-related osteoporosis without current pathological fracture: Secondary | ICD-10-CM | POA: Diagnosis not present

## 2024-01-03 DIAGNOSIS — H02881 Meibomian gland dysfunction right upper eyelid: Secondary | ICD-10-CM | POA: Diagnosis not present

## 2024-01-03 DIAGNOSIS — H40021 Open angle with borderline findings, high risk, right eye: Secondary | ICD-10-CM | POA: Diagnosis not present

## 2024-01-03 DIAGNOSIS — H02884 Meibomian gland dysfunction left upper eyelid: Secondary | ICD-10-CM | POA: Diagnosis not present

## 2024-01-03 DIAGNOSIS — H11053 Peripheral pterygium, progressive, bilateral: Secondary | ICD-10-CM | POA: Diagnosis not present

## 2024-01-03 DIAGNOSIS — H25013 Cortical age-related cataract, bilateral: Secondary | ICD-10-CM | POA: Diagnosis not present

## 2024-01-03 DIAGNOSIS — H2513 Age-related nuclear cataract, bilateral: Secondary | ICD-10-CM | POA: Diagnosis not present

## 2024-01-10 DIAGNOSIS — H25811 Combined forms of age-related cataract, right eye: Secondary | ICD-10-CM | POA: Diagnosis not present

## 2024-01-10 DIAGNOSIS — H11053 Peripheral pterygium, progressive, bilateral: Secondary | ICD-10-CM | POA: Diagnosis not present

## 2024-01-10 DIAGNOSIS — H01002 Unspecified blepharitis right lower eyelid: Secondary | ICD-10-CM | POA: Diagnosis not present

## 2024-01-10 DIAGNOSIS — H01005 Unspecified blepharitis left lower eyelid: Secondary | ICD-10-CM | POA: Diagnosis not present

## 2024-01-23 ENCOUNTER — Ambulatory Visit
Admission: RE | Admit: 2024-01-23 | Discharge: 2024-01-23 | Disposition: A | Discharge status: Home / Self Care | Source: Ambulatory Visit | Attending: Nurse Practitioner | Admitting: Nurse Practitioner

## 2024-01-23 DIAGNOSIS — Z1231 Encounter for screening mammogram for malignant neoplasm of breast: Secondary | ICD-10-CM | POA: Diagnosis not present

## 2024-01-31 DIAGNOSIS — M810 Age-related osteoporosis without current pathological fracture: Secondary | ICD-10-CM | POA: Diagnosis not present

## 2024-02-01 NOTE — Progress Notes (Signed)
 Jill Fritz                                          MRN: [id number]   02/01/2024   The [facility] Quality Team Specialist reviewed this patient medical record for the purposes of chart review for care gap closure. The following were reviewed: chart review for care gap closure-colorectal cancer screening.    [facility] Quality Team

## 2024-02-05 DIAGNOSIS — I10 Essential (primary) hypertension: Secondary | ICD-10-CM | POA: Diagnosis not present

## 2024-02-05 DIAGNOSIS — K219 Gastro-esophageal reflux disease without esophagitis: Secondary | ICD-10-CM | POA: Diagnosis not present

## 2024-02-05 DIAGNOSIS — E039 Hypothyroidism, unspecified: Secondary | ICD-10-CM | POA: Diagnosis not present

## 2024-02-05 DIAGNOSIS — H11051 Peripheral pterygium, progressive, right eye: Secondary | ICD-10-CM | POA: Diagnosis not present

## 2024-02-05 DIAGNOSIS — H11021 Central pterygium of right eye: Secondary | ICD-10-CM | POA: Diagnosis not present

## 2024-02-05 DIAGNOSIS — H179 Unspecified corneal scar and opacity: Secondary | ICD-10-CM | POA: Diagnosis not present

## 2024-02-05 DIAGNOSIS — E663 Overweight: Secondary | ICD-10-CM | POA: Diagnosis not present

## 2024-02-05 DIAGNOSIS — E7800 Pure hypercholesterolemia, unspecified: Secondary | ICD-10-CM | POA: Diagnosis not present

## 2024-02-05 DIAGNOSIS — L578 Other skin changes due to chronic exposure to nonionizing radiation: Secondary | ICD-10-CM | POA: Diagnosis not present

## 2024-04-08 ENCOUNTER — Ambulatory Visit

## 2024-04-08 DIAGNOSIS — Z1211 Encounter for screening for malignant neoplasm of colon: Secondary | ICD-10-CM | POA: Diagnosis not present

## 2024-04-08 DIAGNOSIS — K5730 Diverticulosis of large intestine without perforation or abscess without bleeding: Secondary | ICD-10-CM | POA: Diagnosis not present

## 2024-04-08 DIAGNOSIS — Z538 Procedure and treatment not carried out for other reasons: Secondary | ICD-10-CM | POA: Diagnosis not present
